# Patient Record
Sex: Male | Born: 1975 | Race: White | Hispanic: No | Marital: Single | Smoking: Never smoker
Health system: Southern US, Community
[De-identification: ages and names within clinical notes are randomized; demographics above are authoritative.]

## PROBLEM LIST (undated history)

## (undated) DIAGNOSIS — T07XXXA Unspecified multiple injuries, initial encounter: Secondary | ICD-10-CM

## (undated) HISTORY — DX: Unspecified multiple injuries, initial encounter: T07.XXXA

---

## 2003-04-02 ENCOUNTER — Ambulatory Visit: Admission: RE | Admit: 2003-04-02 | Discharge: 2003-04-02 | Payer: Self-pay | Admitting: Orthopedic Surgery

## 2003-04-02 ENCOUNTER — Encounter: Payer: Self-pay | Admitting: Orthopedic Surgery

## 2011-10-04 ENCOUNTER — Ambulatory Visit (HOSPITAL_COMMUNITY)
Admission: RE | Admit: 2011-10-04 | Discharge: 2011-10-04 | Disposition: A | Discharge status: Home / Self Care | Payer: 59 | Source: Ambulatory Visit | Attending: Internal Medicine | Admitting: Internal Medicine

## 2011-10-04 ENCOUNTER — Other Ambulatory Visit (HOSPITAL_COMMUNITY): Payer: Self-pay | Admitting: Internal Medicine

## 2011-10-04 DIAGNOSIS — R05 Cough: Secondary | ICD-10-CM | POA: Insufficient documentation

## 2011-10-04 DIAGNOSIS — R509 Fever, unspecified: Secondary | ICD-10-CM

## 2011-10-04 DIAGNOSIS — R059 Cough, unspecified: Secondary | ICD-10-CM | POA: Insufficient documentation

## 2011-10-04 DIAGNOSIS — R0682 Tachypnea, not elsewhere classified: Secondary | ICD-10-CM | POA: Insufficient documentation

## 2014-09-16 ENCOUNTER — Ambulatory Visit (HOSPITAL_COMMUNITY)
Admission: RE | Admit: 2014-09-16 | Discharge: 2014-09-16 | Disposition: A | Discharge status: Home / Self Care | Payer: BLUE CROSS/BLUE SHIELD | Source: Ambulatory Visit | Attending: Internal Medicine | Admitting: Internal Medicine

## 2014-09-16 ENCOUNTER — Other Ambulatory Visit (HOSPITAL_COMMUNITY): Payer: Self-pay | Admitting: Internal Medicine

## 2014-09-16 DIAGNOSIS — R0602 Shortness of breath: Secondary | ICD-10-CM | POA: Diagnosis not present

## 2017-01-31 ENCOUNTER — Encounter: Payer: Self-pay | Admitting: Orthopaedic Surgery

## 2017-01-31 ENCOUNTER — Ambulatory Visit (INDEPENDENT_AMBULATORY_CARE_PROVIDER_SITE_OTHER): Payer: BLUE CROSS/BLUE SHIELD

## 2017-01-31 ENCOUNTER — Ambulatory Visit (INDEPENDENT_AMBULATORY_CARE_PROVIDER_SITE_OTHER): Payer: BLUE CROSS/BLUE SHIELD | Admitting: Orthopaedic Surgery

## 2017-01-31 ENCOUNTER — Ambulatory Visit: Payer: BLUE CROSS/BLUE SHIELD

## 2017-01-31 VITALS — BP 122/82 | HR 87 | Temp 97.9°F | Ht 71.0 in | Wt 215.0 lb

## 2017-01-31 DIAGNOSIS — M79645 Pain in left finger(s): Secondary | ICD-10-CM

## 2017-01-31 DIAGNOSIS — M20012 Mallet finger of left finger(s): Secondary | ICD-10-CM

## 2017-01-31 NOTE — Progress Notes (Signed)
   Subjective:    Patient ID: Joseph DuttonDaniel R Mccaffery, male    DOB: [birth year], 41 y.o.   MRN: [id number]  HPI He was putting in child seat in his wife's car last week and hurt his left little finger.  He had pain.  He has been unable to fully extend the finger since then and has swelling and pain.  He got a small splint from the drug store.  He has no other injury, no redness, no numbness.   Review of Systems  HENT: Negative for congestion.   Respiratory: Negative for cough and shortness of breath.   Cardiovascular: Negative for chest pain and leg swelling.  Endocrine: Negative for cold intolerance.  Musculoskeletal: Positive for arthralgias.  Allergic/Immunologic: Negative for environmental allergies.   Past Medical History:  Diagnosis Date  . Fractures     History reviewed. No pertinent surgical history.  No current outpatient prescriptions on file prior to visit.   No current facility-administered medications on file prior to visit.     Social History   Social History  . Marital status: Single    Spouse name: N/A  . Number of children: N/A  . Years of education: N/A   Occupational History  . Not on file.   Social History Main Topics  . Smoking status: Never Smoker  . Smokeless tobacco: Never Used  . Alcohol use Not on file  . Drug use: Unknown  . Sexual activity: Not on file   Other Topics Concern  . Not on file   Social History Narrative  . No narrative on file    History reviewed. No pertinent family history.  BP 122/82   Pulse 87   Temp 97.9 F (36.6 C)   Ht 5\' 11"  (1.803 m)   Wt 215 lb (97.5 kg)   BMI 29.99 kg/m       Objective:   Physical Exam  Constitutional: He is oriented to person, place, and time. He appears well-developed and well-nourished.  HENT:  Head: Normocephalic and atraumatic.  Eyes: Conjunctivae and EOM are normal. Pupils are equal, round, and reactive to light.  Neck: Normal range of motion. Neck supple.  Cardiovascular: Normal  rate, regular rhythm and intact distal pulses.   Pulmonary/Chest: Effort normal.  Abdominal: Soft.  Musculoskeletal: He exhibits deformity (He has tenderness and inability to fully extend the little finger left at the DIP joint.  NV intact.  ROM of hand otherwise negative.  Right hand negative.).  Neurological: He is alert and oriented to person, place, and time. He has normal reflexes. No cranial nerve deficit. He exhibits normal muscle tone. Coordination normal.  Skin: Skin is warm and dry.  Psychiatric: He has a normal mood and affect. His behavior is normal. Judgment and thought content normal.  Vitals reviewed.    X-rays were done of the left hand, reported separately.     Assessment & Plan:   Encounter Diagnoses  Name Primary?  . Finger pain, left Yes  . Mallet deformity of left little finger    I have explained the findings and what a mallet finger is.  I have made and fitted aluminum splint for the little finger and tole him how to use it.  Extra rolls of tape given.  Return in two weeks.  Call if any problem.  Precautions discussed.   Electronically Signed Darreld McleanWayne Termaine Roupp, MD 6/26/20189:37 AM

## 2017-02-14 ENCOUNTER — Ambulatory Visit (INDEPENDENT_AMBULATORY_CARE_PROVIDER_SITE_OTHER): Payer: BLUE CROSS/BLUE SHIELD | Admitting: Orthopaedic Surgery

## 2017-02-14 ENCOUNTER — Encounter: Payer: Self-pay | Admitting: Orthopaedic Surgery

## 2017-02-14 VITALS — BP 117/76 | HR 73 | Temp 96.9°F | Ht 71.0 in | Wt 211.0 lb

## 2017-02-14 DIAGNOSIS — M20012 Mallet finger of left finger(s): Secondary | ICD-10-CM

## 2017-02-14 NOTE — Progress Notes (Signed)
Patient BJ:YNWGNF:Joseph Barrett, male DOB:1976-05-27, 41 y.o. AOZ:[id number]  Chief Complaint  Patient presents with  . Follow-up    Left finger    HPI  Joseph Barrett is a 41 y.o. male who has a mallet finger of the left little finger.  He has been using his splint.  He has no new trauma. HPI  Body mass index is 29.43 kg/m.  ROS  Review of Systems  HENT: Negative for congestion.   Respiratory: Negative for cough and shortness of breath.   Cardiovascular: Negative for chest pain and leg swelling.  Endocrine: Negative for cold intolerance.  Musculoskeletal: Positive for arthralgias.  Allergic/Immunologic: Negative for environmental allergies.    Past Medical History:  Diagnosis Date  . Fractures     History reviewed. No pertinent surgical history.  History reviewed. No pertinent family history.  Social History Social History  Substance Use Topics  . Smoking status: Never Smoker  . Smokeless tobacco: Never Used  . Alcohol use Not on file    No Known Allergies  No current outpatient prescriptions on file.   No current facility-administered medications for this visit.      Physical Exam  Blood pressure 117/76, pulse 73, temperature (!) 96.9 F (36.1 C), height 5\' 11"  (1.803 m), weight 211 lb (95.7 kg).  Constitutional: overall normal hygiene, normal nutrition, well developed, normal grooming, normal body habitus. Assistive device:splint for little finger  Musculoskeletal: gait and station Limp none, muscle tone and strength are normal, no tremors or atrophy is present.  .  Neurological: coordination overall normal.  Deep tendon reflex/nerve stretch intact.  Sensation normal.  Cranial nerves II-XII intact.   Skin:   Normal overall no scars, lesions, ulcers or rashes. No psoriasis.  Psychiatric: Alert and oriented x 3.  Recent memory intact, remote memory unclear.  Normal mood and affect. Well groomed.  Good eye contact.  Cardiovascular: overall no swelling, no  varicosities, no edema bilaterally, normal temperatures of the legs and arms, no clubbing, cyanosis and good capillary refill.  Lymphatic: palpation is normal.  The left little finger has some slight swelling at DIP joint and a lag of full extension by about 10 degrees.  NV intact.  The patient has been educated about the nature of the problem(s) and counseled on treatment options.  The patient appeared to understand what I have discussed and is in agreement with it.  Encounter Diagnosis  Name Primary?  . Mallet deformity of left little finger Yes    PLAN Call if any problems.  Precautions discussed.  Continue current medications.   Return to clinic 1 month   Continue the splinting.  Electronically Signed Darreld McleanWayne Georgann Bramble, MD 7/10/20188:27 AM

## 2017-03-14 ENCOUNTER — Encounter: Payer: Self-pay | Admitting: Orthopaedic Surgery

## 2017-03-14 ENCOUNTER — Ambulatory Visit (INDEPENDENT_AMBULATORY_CARE_PROVIDER_SITE_OTHER): Payer: BLUE CROSS/BLUE SHIELD | Admitting: Orthopaedic Surgery

## 2017-03-14 VITALS — BP 129/81 | HR 89 | Temp 97.8°F | Ht 71.0 in | Wt 216.0 lb

## 2017-03-14 DIAGNOSIS — M20012 Mallet finger of left finger(s): Secondary | ICD-10-CM | POA: Diagnosis not present

## 2017-03-14 NOTE — Progress Notes (Signed)
Patient ZO:XWRUEA:Joseph Barrett, male DOB:[birth year], 41 y.o. [id number]  Chief Complaint  Patient presents with  . Follow-up    mallet left little finger    HPI  Joseph Barrett is a 41 y.o. male who has a mallet finger on the left nondominant little finger at the DIP joint.  He has been using his splint. He has no new trauma.   HPI  Body mass index is 30.13 kg/m.  ROS  Review of Systems  HENT: Negative for congestion.   Respiratory: Negative for cough and shortness of breath.   Cardiovascular: Negative for chest pain and leg swelling.  Endocrine: Negative for cold intolerance.  Musculoskeletal: Positive for arthralgias.  Allergic/Immunologic: Negative for environmental allergies.    Past Medical History:  Diagnosis Date  . Fractures     History reviewed. No pertinent surgical history.  History reviewed. No pertinent family history.  Social History Social History  Substance Use Topics  . Smoking status: Never Smoker  . Smokeless tobacco: Never Used  . Alcohol use Not on file    No Known Allergies  No current outpatient prescriptions on file.   No current facility-administered medications for this visit.      Physical Exam  Blood pressure 129/81, pulse 89, temperature 97.8 F (36.6 C), height 5\' 11"  (1.803 m), weight 216 lb (98 kg).  Constitutional: overall normal hygiene, normal nutrition, well developed, normal grooming, normal body habitus. Assistive device:splint for left little finger  Musculoskeletal: gait and station Limp none, muscle tone and strength are normal, no tremors or atrophy is present.  .  Neurological: coordination overall normal.  Deep tendon reflex/nerve stretch intact.  Sensation normal.  Cranial nerves II-XII intact.   Skin:   Normal overall no scars, lesions, ulcers or rashes. No psoriasis.  Psychiatric: Alert and oriented x 3.  Recent memory intact, remote memory unclear.  Normal mood and affect. Well groomed.  Good eye  contact.  Cardiovascular: overall no swelling, no varicosities, no edema bilaterally, normal temperatures of the legs and arms, no clubbing, cyanosis and good capillary refill.  Lymphatic: palpation is normal.  He has perhaps just a very few degree lag to the DIP joint of the little finger on the left.  I have told him this could change.  If it does appear it gets more flexion that he cannot extend finger, he may need arthrodesis.  But it does look good today and I hope it remains at that level.  Precautions given.  The patient has been educated about the nature of the problem(s) and counseled on treatment options.  The patient appeared to understand what I have discussed and is in agreement with it.  Encounter Diagnosis  Name Primary?  . Mallet deformity of left little finger Yes    PLAN Call if any problems.  Precautions discussed.  Continue current medications.   Return to clinic prn   Electronically Signed Darreld McleanWayne Tracen Mahler, MD 8/7/20189:05 AM

## 2018-04-27 ENCOUNTER — Other Ambulatory Visit (HOSPITAL_COMMUNITY): Payer: Self-pay | Admitting: Internal Medicine

## 2018-04-27 DIAGNOSIS — M545 Low back pain: Secondary | ICD-10-CM

## 2018-05-02 ENCOUNTER — Other Ambulatory Visit: Payer: Self-pay | Admitting: Internal Medicine

## 2018-05-02 ENCOUNTER — Ambulatory Visit (HOSPITAL_COMMUNITY): Payer: BLUE CROSS/BLUE SHIELD

## 2018-05-02 DIAGNOSIS — M545 Low back pain: Secondary | ICD-10-CM

## 2018-05-08 ENCOUNTER — Ambulatory Visit
Admission: RE | Admit: 2018-05-08 | Discharge: 2018-05-08 | Disposition: A | Discharge status: Home / Self Care | Payer: BLUE CROSS/BLUE SHIELD | Source: Ambulatory Visit | Attending: Internal Medicine | Admitting: Internal Medicine

## 2018-05-08 DIAGNOSIS — M545 Low back pain, unspecified: Secondary | ICD-10-CM

## 2018-07-13 ENCOUNTER — Emergency Department (HOSPITAL_COMMUNITY)
Admission: EM | Admit: 2018-07-13 | Discharge: 2018-07-13 | Disposition: A | Discharge status: Home / Self Care | Payer: BLUE CROSS/BLUE SHIELD | Attending: Emergency Medicine | Admitting: Emergency Medicine

## 2018-07-13 ENCOUNTER — Other Ambulatory Visit: Payer: Self-pay

## 2018-07-13 ENCOUNTER — Encounter (HOSPITAL_COMMUNITY): Payer: Self-pay | Admitting: *Deleted

## 2018-07-13 DIAGNOSIS — M545 Low back pain: Secondary | ICD-10-CM | POA: Diagnosis present

## 2018-07-13 DIAGNOSIS — M5442 Lumbago with sciatica, left side: Secondary | ICD-10-CM | POA: Diagnosis not present

## 2018-07-13 MED ORDER — HYDROCODONE-ACETAMINOPHEN 5-325 MG PO TABS: 1.0000 | ORAL_TABLET | Freq: Four times a day (QID) | ORAL | 0 refills | Status: AC | PRN

## 2018-07-13 MED ORDER — HYDROCODONE-ACETAMINOPHEN 5-325 MG PO TABS
2.0000 | ORAL_TABLET | Freq: Once | ORAL | Status: AC
  Administered 2018-07-13: 2 via ORAL
  Filled 2018-07-13: qty 2

## 2018-07-13 MED ORDER — KETOROLAC TROMETHAMINE 15 MG/ML IJ SOLN
15.0000 mg | Freq: Once | INTRAMUSCULAR | Status: AC
  Administered 2018-07-13: 15 mg via INTRAMUSCULAR
  Filled 2018-07-13: qty 1

## 2018-07-13 MED ORDER — LIDOCAINE 5 % EX PTCH
1.0000 | MEDICATED_PATCH | CUTANEOUS | Status: DC
  Administered 2018-07-13: 1 via TRANSDERMAL
  Filled 2018-07-13: qty 1

## 2018-07-13 MED ORDER — LIDOCAINE 5 % EX PTCH: 1.0000 | MEDICATED_PATCH | CUTANEOUS | 0 refills | Status: AC

## 2018-07-13 NOTE — ED Triage Notes (Signed)
Increased back pain with known bulging disk . Pain worse since WED. Pt has been taking a dose pack.  Pt radiates to Lt side of to Lt leg.

## 2018-07-13 NOTE — Discharge Instructions (Addendum)
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  At this time there does not appear to be the presence of an emergent medical condition, however there is always the potential for conditions to change. Please read and follow the below instructions.  Please return to the Emergency Department immediately for any new or worsening symptoms Please be sure to follow up with your Primary Care Provider next week regarding your visit today; please call their office to schedule an appointment even if you are feeling better for a follow-up visit. Please continue using your prednisone Dosepak as provided by your primary care provider, these medications may take a few days to work.  Please do not use your NSAID, Mobic over the next 2 days as you have been given Toradol today.  Please drink plenty of water.  Please continue using your muscle relaxer as previously prescribed, this medication may also take a few days to work, do not drive or operate machinery while taking this medication because it can make you drowsy.  Please use heating pads to help with your symptoms. Please follow-up with your neurosurgeon regarding your back pain.  I have also provided to you a referral to our on-call neurosurgery clinic at [facility] you may call them for further evaluation of your back pain. You may use the lidoderm patch as prescribed to help with your symptoms. You have been provided with a short course of pain medication today called Norco.  Do not drive or operate machinery or drink alcohol or take this medication.  Get help right away if: You have weakness or numbness in one or both of your legs or feet. You have trouble controlling your bladder or your bowels. You have nausea or vomiting. You have pain in your abdomen. You cannot control when you pee (urinate) or poop (have a bowel movement). You have weakness in any of these areas and it gets worse. Lower  back. Lower belly (pelvis). Butt (buttocks). Legs. You have redness or swelling of your back. You have a burning feeling when you pee. You have shortness of breath or you faint.  Please read the additional information packets attached to your discharge summary.  Do not take your medicine if  develop an itchy rash, swelling in your mouth or lips, or difficulty breathing.

## 2018-07-13 NOTE — ED Provider Notes (Signed)
[facility] EMERGENCY DEPARTMENT Provider Note   CSN: [id number] Arrival date & time: 07/13/18  1003     History   Chief Complaint Chief Complaint  Patient presents with  . Back Pain    HPI Joseph Barrett is a 42 y.o. male presenting today for acute on chronic lower back pain with left-sided sciatica.  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  Patient states that after diagnosis he was given a prednisone pack which relieved his pain and he had minimal pain for the past few months.  Patient states that 2 days ago when he got out of bed he had an increase in pain.  Denies injury or trauma to the area.  Patient states that it is the same pain that he has had in the past, left-sided lower back pain radiating down the left leg, sharp/aching, severe, constant and worse with movement.  Patient has taken 2 doses of Mobic without relief of symptoms.  Patient also started prednisone 2 days ago, he has not taken his third dose of prednisone today.  Patient has also been taking Flexeril as prescribed by primary care provider without relief over the past 2 days.  Patient denies fever, chills, IV drug use, injury/trauma, saddle area paresthesias, bowel/bladder incontinence or change in nature of his pain.    MRI in October shows small left subarticular disc protrusion at L5-S1 impinging upon the descending left S1 nerve root in the left lateral recess as well as a moderate noncompressive bulging disc at L2-3 through L4-5 without significant stenosis or neural impingement. HPI  Past Medical History:  Diagnosis Date  . Fractures     There are no active problems to display for this patient.   History reviewed. No pertinent surgical history.      Home Medications    Prior to Admission medications   Medication Sig Start Date End Date Taking? Authorizing Provider  HYDROcodone-acetaminophen  (NORCO/VICODIN) 5-325 MG tablet Take 1-2 tablets by mouth every 6 (six) hours as needed. 07/13/18   Harlene Salts A, PA-C  lidocaine (LIDODERM) 5 % Place 1 patch onto the skin daily. Remove & Discard patch within 12 hours or as directed by MD 07/13/18   Bill Salinas, PA-C    Family History History reviewed. No pertinent family history.  Social History Social History   Tobacco Use  . Smoking status: Never Smoker  . Smokeless tobacco: Never Used  Substance Use Topics  . Alcohol use: Not Currently  . Drug use: Never     Allergies   Patient has no known allergies.   Review of Systems Review of Systems  Constitutional: Negative.  Negative for chills and fever.  Gastrointestinal: Negative.  Negative for abdominal pain, nausea and vomiting.  Genitourinary: Negative.  Negative for difficulty urinating, dysuria and hematuria.  Musculoskeletal: Positive for back pain. Negative for joint swelling and neck pain.  Neurological: Positive for numbness (Tingling of left lower extremity). Negative for dizziness, syncope, weakness and headaches.       Denies bowel/bladder incontinence Denies saddle area paresthesias   Physical Exam Updated Vital Signs BP 124/84 (BP Location: Right Arm)   Pulse 79   Temp 98.3 F (36.8 C) (Oral)   Resp 16   Ht 5\' 11"  (1.803 m)   Wt 95.3 kg   SpO2 100%   BMI 29.29 kg/m   Physical Exam  Constitutional: He appears well-developed and well-nourished.  No distress.  HENT:  Head: Normocephalic and atraumatic.  Right Ear: External ear normal.  Left Ear: External ear normal.  Nose: Nose normal.  Eyes: Pupils are equal, round, and reactive to light. EOM are normal.  Neck: Trachea normal, normal range of motion, full passive range of motion without pain and phonation normal. Neck supple. No tracheal tenderness, no spinous process tenderness and no muscular tenderness present. No tracheal deviation present.  Cardiovascular:  Pulses:      Dorsalis pedis  pulses are 2+ on the right side, and 2+ on the left side.       Posterior tibial pulses are 2+ on the right side, and 2+ on the left side.  Pulmonary/Chest: Effort normal and breath sounds normal. No respiratory distress.  Abdominal: Soft. There is no tenderness. There is no rigidity, no rebound and no guarding.  Musculoskeletal: Normal range of motion.       Back:  No cervical or thoracic midline tenderness to palpation.  No crepitus step-off or deformity of the cervical or thoracic spine.  No paraspinal muscular tenderness at the cervical or thoracic levels.  Patient with left-sided lumbar muscular tenderness to palpation, no crepitus step-off or deformity of the lumbar spine.  No midline lumbar spinal tenderness to palpation.   Patient with left gluteal muscular tenderness to palpation.  Positive straight leg test on the left side.  Hips stable to palpation.  Feet:  Right Foot:  Protective Sensation: 3 sites tested. 3 sites sensed.  Left Foot:  Protective Sensation: 3 sites tested. 3 sites sensed.  Neurological: He is alert. GCS eye subscore is 4. GCS verbal subscore is 5. GCS motor subscore is 6.  Speech is clear and goal oriented, follows commands Major Cranial nerves without deficit, no facial droop Normal strength in upper and lower extremities bilaterally including dorsiflexion and plantar flexion, strong and equal grip strength Sensation normal to light touch Moves extremities without ataxia, coordination intact  Skin: Skin is warm and dry. Capillary refill takes less than 2 seconds.  Psychiatric: He has a normal mood and affect. His behavior is normal.   ED Treatments / Results  Labs (all labs ordered are listed, but only abnormal results are displayed) Labs Reviewed - No data to display  EKG None  Radiology No results found.   Procedures Procedures (including critical care time)  Medications Ordered in ED Medications  lidocaine (LIDODERM) 5 % 1 patch (1 patch  Transdermal Patch Applied 07/13/18 1115)  HYDROcodone-acetaminophen (NORCO/VICODIN) 5-325 MG per tablet 2 tablet (2 tablets Oral Given 07/13/18 1115)  ketorolac (TORADOL) 15 MG/ML injection 15 mg (15 mg Intramuscular Given 07/13/18 1115)     Initial Impression / Assessment and Plan / ED Course  I have reviewed the triage vital signs and the nursing notes.  Pertinent labs & imaging results that were available during my care of the patient were reviewed by me and considered in my medical decision making (see chart for details).    42 year old male with known disc herniation, recent MRI in October presented today for acute on chronic low back pain with left-sided sciatica.  No neurological deficits and normal neuro exam today.  Patient can walk but states is painful.  Patient denies loss of bowel/bladder control or saddle area paresthesias.  No concern for cauda equina.  No fever, night sweats, weight loss, h/o cancer, or IVDU. RICE protocol and pain medicine indicated and discussed with patient.   Pain controlled in emergency department, patient resting comfortably no acute  distress.  Patient was given 15 mg IM Toradol today, denies history of CKD or gastric bleeding/ulcers.  Patient also given to Norco today during this visit.  Patient's wife is here to drive him home.   Patient encouraged to follow-up with his neurosurgeon, patient and his wife state that they have an appointment on Monday, 3 days from now.  Patient has been provided with short course of pain medication to last him over the weekend, he has been informed not to drive or operate heavy machinery or drink alcohol while taking Norco.  6 pills of Norco prescribed.  [place] PMP aware reviewed, patient is without narcotic prescriptions on database.  I feel that is reasonable to provide short course of pain relief for severe pain with patient's known sciatica/bulging disc.  Patient encouraged to continue using his muscle relaxers and prednisone as  previously prescribed.  Patient informed to avoid using his Mobic prescription or other NSAIDs over the next 2 days since he was given Toradol today.  Encouraged to drink plenty water.  Discharge patient afebrile, not tachycardic, not hypotensive, well-appearing and in no acute distress.  Ambulatory without assistance.  At this time there does not appear to be any evidence of an acute emergency medical condition and the patient appears stable for discharge with appropriate outpatient follow up. Diagnosis was discussed with patient who verbalizes understanding of care plan and is agreeable to discharge. I have discussed return precautions with patient and his wife who verbalize understanding of return precautions. Patient strongly encouraged to follow-up with their PCP within 1 week and go to neurosurgery appointment on Monday. All questions answered.  Patient was independently seen and evaluated by Dr. Adriana Simasook during this visit who agrees that patient may be discharged with neurosurgery follow-up and short course of pain medication instead.  Patient's wife is here to drive him home today.  Note: Portions of this report may have been transcribed using voice recognition software. Every effort was made to ensure accuracy; however, inadvertent computerized transcription errors may still be present. Final Clinical Impressions(s) / ED Diagnoses   Final diagnoses:  Left-sided low back pain with left-sided sciatica, unspecified chronicity    ED Discharge Orders         Ordered    lidocaine (LIDODERM) 5 %  Every 24 hours     07/13/18 1314    HYDROcodone-acetaminophen (NORCO/VICODIN) 5-325 MG tablet  Every 6 hours PRN     07/13/18 1333           Elizabeth PalauMorelli, Etan Vasudevan A, PA-C 07/13/18 1345    Donnetta Hutchingook, Brian, MD 07/14/18 1437

## 2019-10-30 ENCOUNTER — Ambulatory Visit: Payer: BLUE CROSS/BLUE SHIELD | Admitting: Orthopaedic Surgery

## 2019-11-29 IMAGING — MR MR LUMBAR SPINE W/O CM
5 series · 48 of 48 positions shown · non-contrast
Comparison: None.

CLINICAL DATA: Initial evaluation for left leg and gluteal pain
with low back pain for 1 month.

EXAM:
MRI LUMBAR SPINE WITHOUT CONTRAST
TECHNIQUE: Multiplanar, multisequence MR imaging of the lumbar spine was
performed. No intravenous contrast was administered.

[Series 3: tirm sag · sagittal · 4.0mm · 0.55mm/px · 4 of 15 slices shown]
[im 1/15]
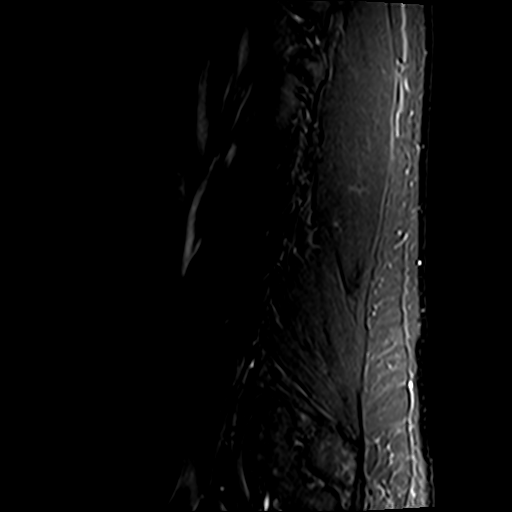
[im 5/15]
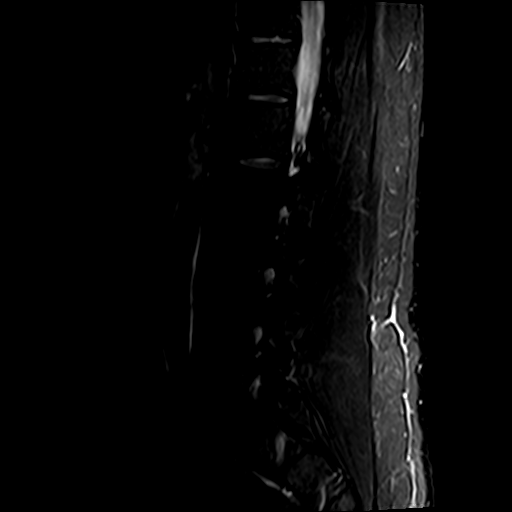
[im 10/15]
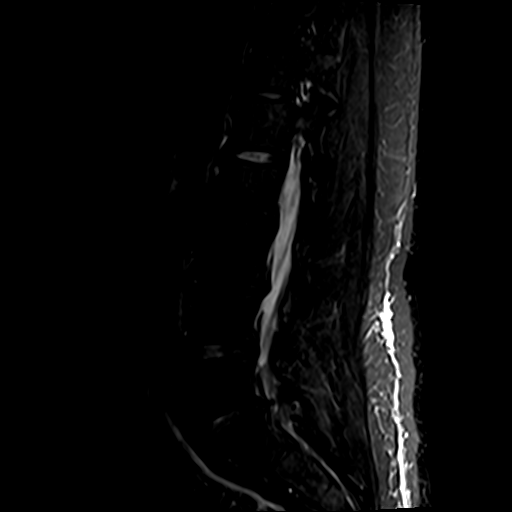
[im 15/15]
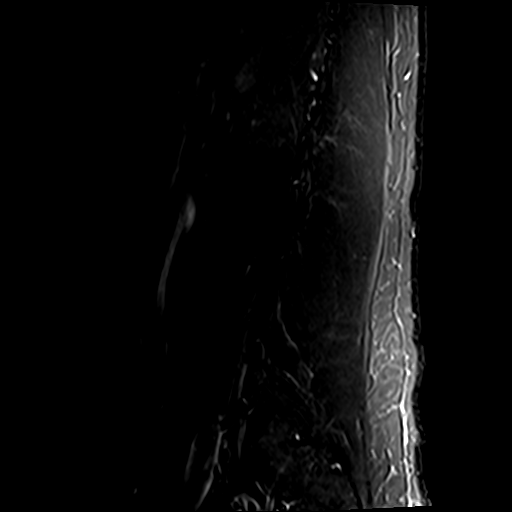

[Series 4: T2 · sagittal · 4.0mm · 0.88mm/px · 5 of 15 slices shown (1 of 2)]
[im 1/15]
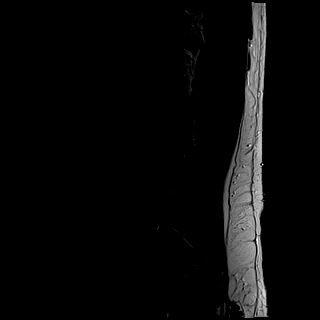
[im 4/15]
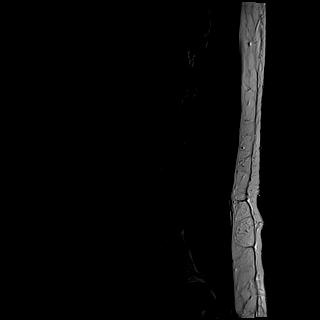
[im 8/15]
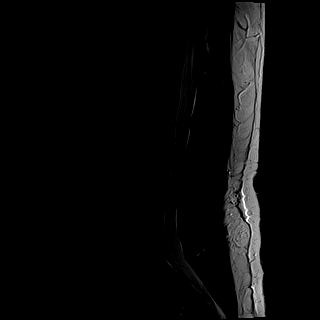
[im 11/15]
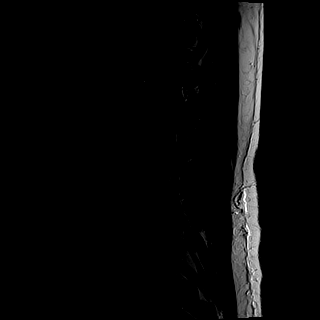
[im 15/15]
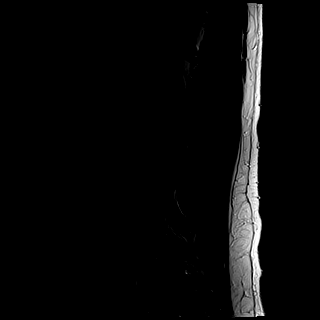

[Series 5: T1 · sagittal · 4.0mm · 0.88mm/px · 5 of 15 slices shown (1 of 2)]
[im 1/15]
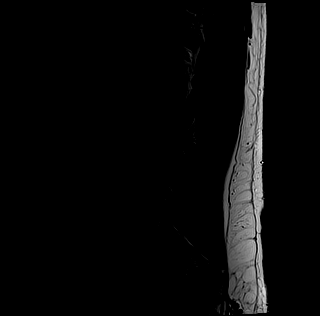
[im 4/15]
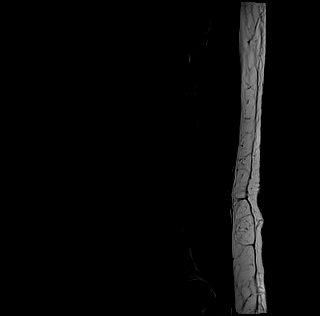
[im 8/15]
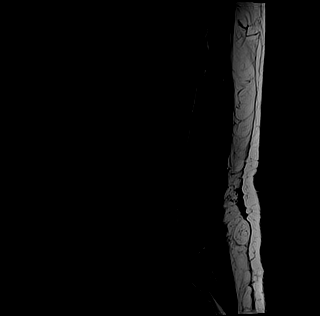
[im 11/15]
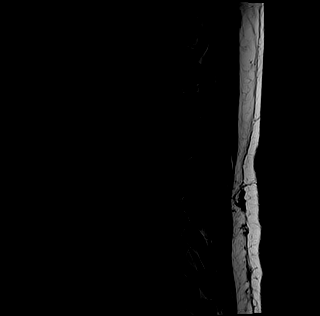
[im 15/15]
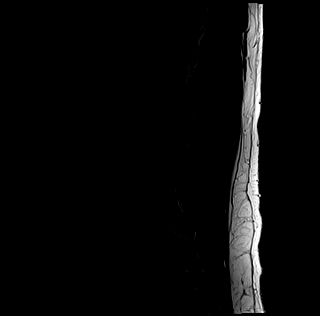

[Series 6: T1 · axial · 4.0mm · 0.78mm/px · z∈[-152,+105]mm · 17 of 50 slices shown (2 of 2)]
[im 1/50]
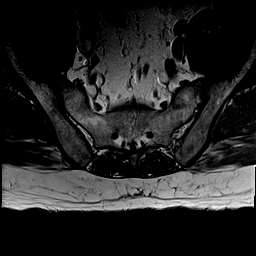
[im 4/50]
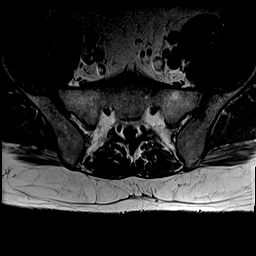
[im 7/50]
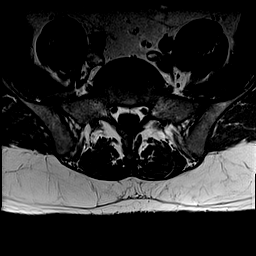
[im 10/50]
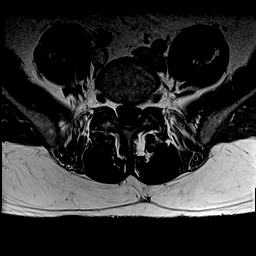
[im 13/50]
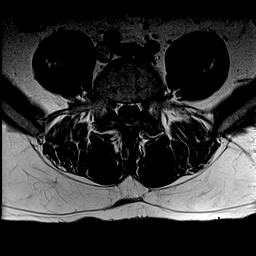
[im 16/50]
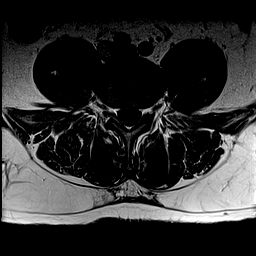
[im 19/50]
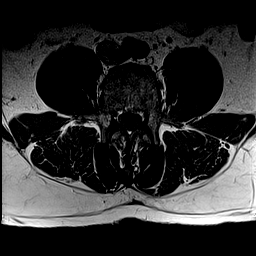
[im 22/50]
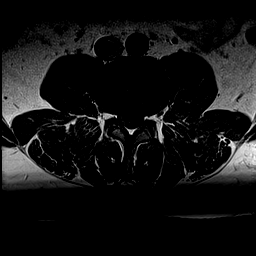
[im 25/50]
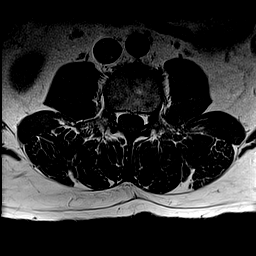
[im 28/50]
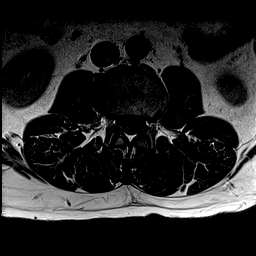
[im 31/50]
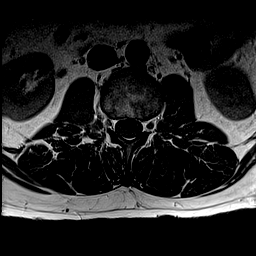
[im 34/50]
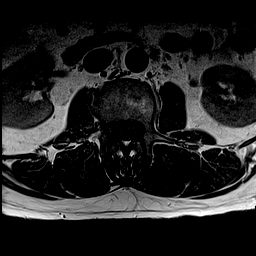
[im 37/50]
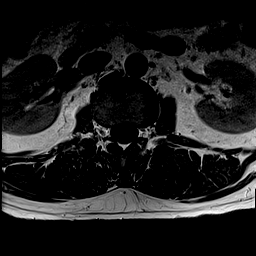
[im 40/50]
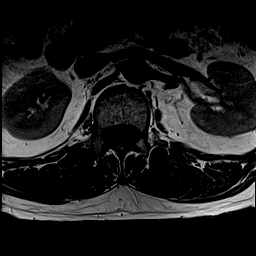
[im 43/50]
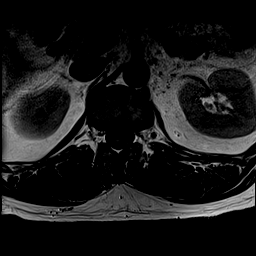
[im 46/50]
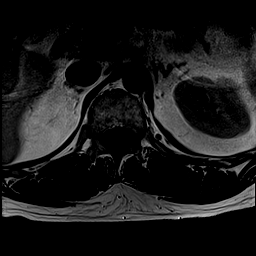
[im 50/50]
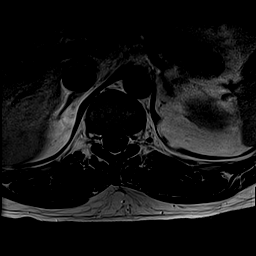

[Series 7: T2 · axial · 4.0mm · 0.78mm/px · z∈[-152,+105]mm · 17 of 50 slices shown (2 of 2)]
[im 1/50]
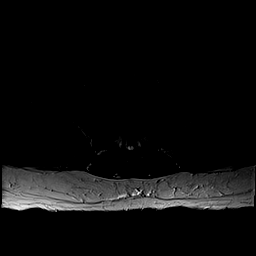
[im 4/50]
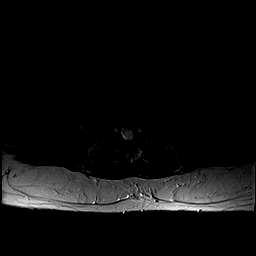
[im 7/50]
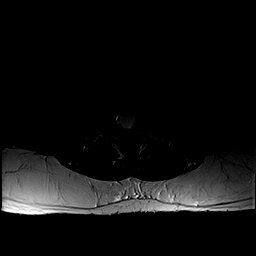
[im 10/50]
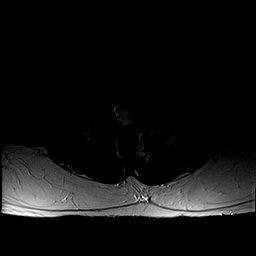
[im 13/50]
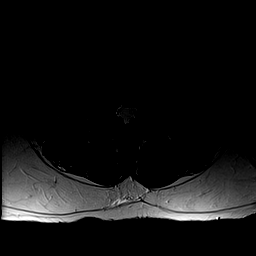
[im 16/50]
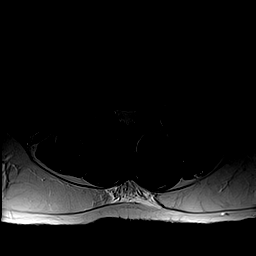
[im 19/50]
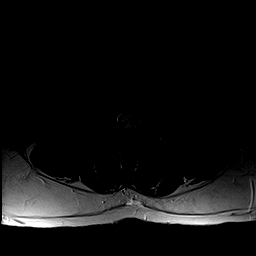
[im 22/50]
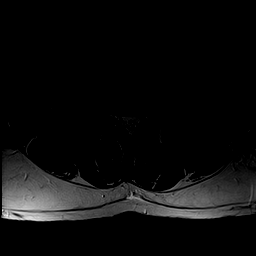
[im 25/50]
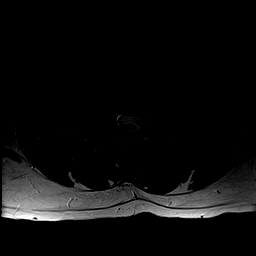
[im 28/50]
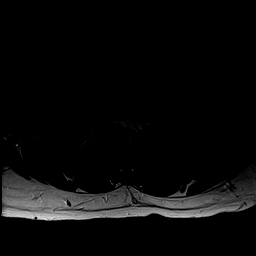
[im 31/50]
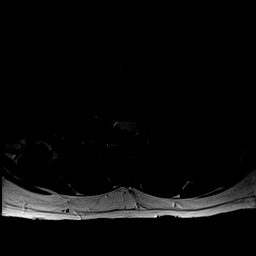
[im 34/50]
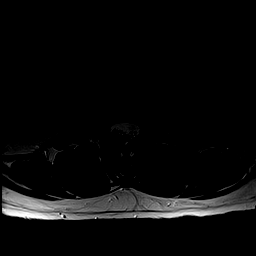
[im 37/50]
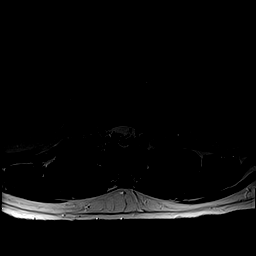
[im 40/50]
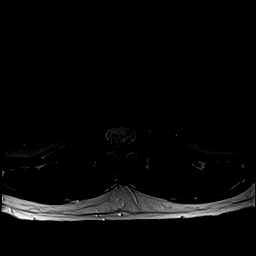
[im 43/50]
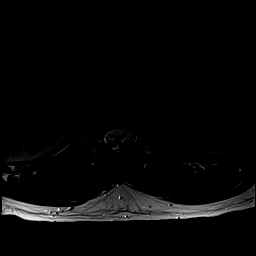
[im 46/50]
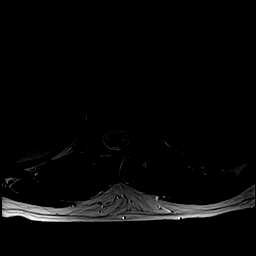
[im 50/50]
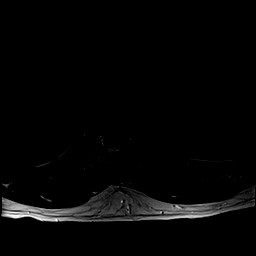

[48 of 48 positions shown; findings below may reference images not displayed]

FINDINGS: Segmentation: Normal segmentation. Lowest well-formed disc labeled
the L5-S1 level.

Alignment: Mild levoscoliosis. Vertebral bodies otherwise normally
aligned with preservation of the normal lumbar lordosis. No
listhesis.

Vertebrae: Vertebral body heights maintained without evidence for
acute or chronic fracture. Bone marrow signal intensity within
normal limits. No discrete or worrisome osseous lesions. No abnormal
marrow edema.

Conus medullaris and cauda equina: Conus extends to the L1 level.
Conus and cauda equina appear normal.

Paraspinal and other soft tissues: Paraspinous soft tissues within
normal limits. Visceral structures unremarkable.

Disc levels:

L1-2:  Unremarkable.

L2-3: Mild diffuse disc bulge with disc desiccation. Disc bulging
slightly asymmetric to the right without focal disc herniation. No
canal or foraminal stenosis.

L3-4: Diffuse disc bulge with disc desiccation and intervertebral
disc space narrowing. No significant canal or foraminal stenosis.

L4-5: Mild annular disc bulge with disc desiccation. No canal or
foraminal stenosis.

L5-S1: Disc bulge with disc desiccation. Superimposed left
subarticular disc protrusion impinges upon the descending left S1
nerve root in the left lateral recess. Central canal remains patent.
No foraminal stenosis.
IMPRESSION: 1. Small left subarticular disc protrusion at L5-S1 impinging upon
the descending left S1 nerve root in the left lateral recess.
2. Mild noncompressive disc bulging at L2-3 through L4-5 without
significant stenosis or neural impingement.

## 2024-09-04 ENCOUNTER — Ambulatory Visit: Payer: Self-pay
